# Patient Record
Sex: Male | Born: 1990 | Race: White | Hispanic: Yes | Marital: Single | Smoking: Never smoker
Health system: Southern US, Community
[De-identification: ages and names within clinical notes are randomized; demographics above are authoritative.]

## PROBLEM LIST (undated history)

## (undated) DIAGNOSIS — F1910 Other psychoactive substance abuse, uncomplicated: Secondary | ICD-10-CM

## (undated) HISTORY — DX: Other psychoactive substance abuse, uncomplicated: F19.10

---

## 2013-07-05 ENCOUNTER — Ambulatory Visit: Payer: No Typology Code available for payment source

## 2013-07-05 ENCOUNTER — Telehealth: Payer: Self-pay | Admitting: Family Medicine

## 2013-07-05 ENCOUNTER — Ambulatory Visit (INDEPENDENT_AMBULATORY_CARE_PROVIDER_SITE_OTHER): Payer: No Typology Code available for payment source | Admitting: Family Medicine

## 2013-07-05 VITALS — BP 108/70 | HR 53 | Temp 98.1°F | Resp 16 | Ht 68.0 in | Wt 144.0 lb

## 2013-07-05 DIAGNOSIS — R002 Palpitations: Secondary | ICD-10-CM

## 2013-07-05 DIAGNOSIS — R21 Rash and other nonspecific skin eruption: Secondary | ICD-10-CM

## 2013-07-05 DIAGNOSIS — R079 Chest pain, unspecified: Secondary | ICD-10-CM

## 2013-07-05 DIAGNOSIS — L738 Other specified follicular disorders: Secondary | ICD-10-CM

## 2013-07-05 DIAGNOSIS — I498 Other specified cardiac arrhythmias: Secondary | ICD-10-CM

## 2013-07-05 DIAGNOSIS — L739 Follicular disorder, unspecified: Secondary | ICD-10-CM

## 2013-07-05 DIAGNOSIS — L678 Other hair color and hair shaft abnormalities: Secondary | ICD-10-CM

## 2013-07-05 DIAGNOSIS — R001 Bradycardia, unspecified: Secondary | ICD-10-CM

## 2013-07-05 LAB — COMPREHENSIVE METABOLIC PANEL
ALT: 19 U/L (ref 0–53)
AST: 29 U/L (ref 0–37)
Albumin: 4.6 g/dL (ref 3.5–5.2)
Chloride: 105 mEq/L (ref 96–112)
Creat: 1.04 mg/dL (ref 0.50–1.35)
Sodium: 138 mEq/L (ref 135–145)
Total Bilirubin: 0.5 mg/dL (ref 0.3–1.2)
Total Protein: 7 g/dL (ref 6.0–8.3)

## 2013-07-05 LAB — COMPREHENSIVE METABOLIC PANEL WITH GFR
Alkaline Phosphatase: 72 U/L (ref 39–117)
BUN: 14 mg/dL (ref 6–23)
CO2: 22 meq/L (ref 19–32)
Calcium: 9.7 mg/dL (ref 8.4–10.5)
Glucose, Bld: 120 mg/dL — ABNORMAL HIGH (ref 70–99)
Potassium: 4 meq/L (ref 3.5–5.3)

## 2013-07-05 MED ORDER — DOXYCYCLINE HYCLATE 100 MG PO TABS: 100.0000 mg | ORAL_TABLET | Freq: Two times a day (BID) | ORAL | Status: AC

## 2013-07-05 NOTE — Progress Notes (Signed)
 Chief Complaint:  Chief Complaint  Patient presents with  . Palpitations  . Loss of Consciousness    passed out once two weeks after doing cocaine. Has felt palpitations since then.  . Cyst    buttocks. x 1 year    HPI: Nathaniel Humphrey is a 23 y.o. male who is here for: 1. Left sided cyst rash for 1 year, he has them off and on, he does sweat some. Prior to all of this he was scratching the area, there is some pain when he sits on it.   2. He had LOC for about about 4 sec ging from sitting to standing position at dinner table 2 weeks ago. No other signs ie confusion, seizures, HAs, gait cahnges, vision changes,  lethargy, weakness, SOB. This was a witnessed event 2 weeks ago, it has not happened since. OF pertinent interest 4  weeks ago was the first time he used cocaine, he used the cocaine at a party. Since then he has had intermittent CP, mostly on right side. He felt he has had right sided non-radiating CP,  palpitations. His chest is not hurting as much as it did 4 weeks ago. He has been doing cardiovascular exercise, he has been running and doing his usual weightlifitng without problems. No loss of concentration or lethargy. No family history of seizures or arrhthmias.   Prior to all of this he did not have any heart problems. No thyroid issues. No unintentional weightloss. Denies HA, confusion, SI/HI/halluciantions, N/V/abd pain, diaphoresius. He smokes cannibus sometimes, last week was his last use of cannibis. No prior problems with cannibis and heart in the past. He has been working out, last time in the gym was Friday , he does a lot of cardio and lifting. Takes mutlivitamins but no other supplements ie caffeine or herbal.   Past Medical History  Diagnosis Date  . Substance abuse     cocaine use for the first time about 2 weeks ago, had CP and palpitations, also uses occ marijuan   History reviewed. No pertinent past surgical history. History   Social History  . Marital  Status: Single    Spouse Name: N/A    Number of Children: N/A  . Years of Education: N/A   Social History Main Topics  . Smoking status: Never Smoker   . Smokeless tobacco: None  . Alcohol Use: Yes  . Drug Use: Yes    Special: Marijuana  . Sexual Activity: Yes    Partners: Female   Other Topics Concern  . None   Social History Narrative  . None   Family History  Problem Relation Age of Onset  . Hepatitis B Mother   . Hypertension Father   . Hypertension Paternal Grandmother   . Diabetes Paternal Grandmother    No Known Allergies Prior to Admission medications   Medication Sig Start Date End Date Taking? Authorizing Provider  Multiple Vitamin (MULTIVITAMIN) tablet Take 1 tablet by mouth daily.   Yes Historical Provider, MD     ROS: The patient denies fevers, chills, night sweats, unintentional weight loss,  wheezing, dyspnea on exertion, nausea, vomiting, abdominal pain, dysuria, hematuria, melena, numbness, weakness, or tingling.   All other systems have been reviewed and were otherwise negative with the exception of those mentioned in the HPI and as above.    PHYSICAL EXAM: Filed Vitals:   07/05/13 1140  BP: 108/70  Pulse: 53 ( he is a runner)  Temp: 98.1 F (36.7  C)  Resp: 16  Spo2 100% Filed Vitals:   07/05/13 1140  Height: 5\' 8"  (1.727 m)  Weight: 144 lb (65.318 kg)   Body mass index is 21.9 kg/(m^2).  General: Alert, no acute distress HEENT:  Normocephalic, atraumatic, oropharynx patent. EOMI, PERRLA, fundoscopic exam nl.No thyroidmegaly Cardiovascular:  Sinus Brady, no rubs murmurs or gallops.  No Carotid bruits, radial pulse intact. No pedal edema.  Respiratory: Clear to auscultation bilaterally.  No wheezes, rales, or rhonchi.  No cyanosis, no use of accessory musculature GI: No organomegaly, abdomen is soft and non-tender, positive bowel sounds.  No masses. Skin: + scarring from prior buttock abscesses on left butt cheek, there is one area of  minimal fluctauance on left middle butt cheek, tender, noo erythema. Right buttcheek has some old scars as well Neurologic: Facial musculature symmetric. CN 2-12 grossly intact.  Psychiatric: Patient is appropriate throughout our interaction. Lymphatic: No cervical lymphadenopathy Musculoskeletal: Gait intact.   LABS: No results found for this or any previous visit.   EKG/XRAY:   [facility] EKG NSR 49-53 bpm,no ST elevation or depression, no QT prolongation, nonspecific peaked Ts in ant leads CXR show no acute cardiopulmonary process   ASSESSMENT/PLAN: Encounter Diagnoses  Name Primary?  . Chest pain Yes  . Palpitations   . Rash and nonspecific skin eruption   . Folliculitis   . Sinus bradycardia by electrocardiogram    Pleasant 23 year old male who is here with resolved/resolving nonradiating right sided CP which started 4 weeks ago after using cocaine for the 1st time. This was associated with a 1 time episode of LOC which sounds like orthostatic hypotension from sitting to standing position  witnessed by his friend who was also at the dinner table  with him. He also occasionally does marijuana. He states he is not doing anymore drugs because he had this health scare. Wants to know if his heart is ok. Since his sxs are mostly resolved, I do not feel the need to get any labs stat. He does not need drug screen since he has admitted to drug use.  EKG was sinus brady--he is a runner Labs pending CBC, CMP, troponin I  ( we forgot to draw labs, he will come back at 3:30 to do this, already been ordered) Chest xray-patient was discharged without having CXR completed. He will return to get CXR and labs done only, Lucianne MussLiz Egan will read CXR for me.  He declined referral to cardiology Neuro exam was normal , will cont to monitor for changes, if he has any then need to go to ER  Advise to stop using recreational drugs, supplements  Attempted to IandD minimal  folliculitis/abscess-no pus, he will be rx doxycycline for folliculitis.  F/u prn or go to ER is worsenign sxs.    Gross sideeffects, risk and benefits, and alternatives of medications d/w patient. Patient is aware that all medications have potential sideeffects and we are unable to predict every sideeffect or drug-drug interaction that may occur.  ,  PHUONG, DO 07/05/2013 2:40 PM   Patient came back for labs, he had a vagal episode and brady down while getting his blood drawn. He states that t has happened before when he had wisdom teeth taken out. Our CMA mentioned he might have convulsed alittle as well. His VSS after all of this. We trie putting in trendelenburg and raisine his legs as best as we could. BP 100/70, HR 55-60. CXR was WNL.  Only able to obtain CMP and troponin due to incomplete blood draw. He was neurologically ad hemodynamically intact at time od discharge.He agrees to be referred to cardiology.

## 2013-07-05 NOTE — Progress Notes (Deleted)
   Subjective:    Patient ID: Nathaniel Humphrey, male    DOB: 08/28/1990, [age] y.o.   MRN: [id number]  HPI pt here c/o of a cyst on       Chief Complaint:  Chief Complaint  Patient presents with  . Palpitations  . Loss of Consciousness    passed out once two weeks after doing cocaine. Has felt palpitations since then.  . Cyst    buttocks. x 1 year    HPI: Nathaniel Humphrey is a 23 y.o. male who is here for  ***  History reviewed. No pertinent past medical history. History reviewed. No pertinent past surgical history. History   Social History  . Marital Status: Single    Spouse Name: N/A    Number of Children: N/A  . Years of Education: N/A   Social History Main Topics  . Smoking status: Never Smoker   . Smokeless tobacco: None  . Alcohol Use: Yes  . Drug Use: Yes    Special: Marijuana  . Sexual Activity: Yes    Partners: Female   Other Topics Concern  . None   Social History Narrative  . None   Family History  Problem Relation Age of Onset  . Hepatitis B Mother   . Hypertension Father   . Hypertension Paternal Grandmother   . Diabetes Paternal Grandmother    No Known Allergies Prior to Admission medications   Medication Sig Start Date End Date Taking? Authorizing Provider  Multiple Vitamin (MULTIVITAMIN) tablet Take 1 tablet by mouth daily.   Yes Historical Provider, MD     ROS: The patient denies fevers, chills, night sweats, unintentional weight loss, chest pain, palpitations, wheezing, dyspnea on exertion, nausea, vomiting, abdominal pain, dysuria, hematuria, melena, numbness, weakness, or tingling. ***  All other systems have been reviewed and were otherwise negative with the exception of those mentioned in the HPI and as above.    PHYSICAL EXAM: Filed Vitals:   07/05/13 1140  BP: 108/70  Pulse: 53  Temp: 98.1 F (36.7 C)  Resp: 16   Filed Vitals:   07/05/13 1140  Height: 5\' 8"  (1.727 m)  Weight: 144 lb (65.318 kg)   Body mass index is 21.9  kg/(m^2).  General: Alert, no acute distress HEENT:  Normocephalic, atraumatic, oropharynx patent. EOMI, PERRLA Cardiovascular:  Regular rate and rhythm, no rubs murmurs or gallops.  No Carotid bruits, radial pulse intact. No pedal edema.  Respiratory: Clear to auscultation bilaterally.  No wheezes, rales, or rhonchi.  No cyanosis, no use of accessory musculature GI: No organomegaly, abdomen is soft and non-tender, positive bowel sounds.  No masses. Skin: No rashes. Neurologic: Facial musculature symmetric. Psychiatric: Patient is appropriate throughout our interaction. Lymphatic: No cervical lymphadenopathy Musculoskeletal: Gait intact.   LABS: No results found for this or any previous visit.   EKG/XRAY:   Primary [facility].   ASSESSMENT/PLAN: No diagnosis found.   Gross sideeffects, risk and benefits, and alternatives of medications d/w patient. Patient is aware that all medications have potential sideeffects and we are unable to predict every sideeffect or drug-drug interaction that may occur.  Braxton FeathersFabiola, Caria Transue, [facility] 07/05/2013 12:21 PM       Review of Systems     Objective:   Physical Exam        Assessment & Plan:

## 2013-07-05 NOTE — Telephone Encounter (Addendum)
LM and text to return to office to get CMP, Troponin and also chest xray. He was discharged before this was completed. I have spoken with Lucianne MussLiz Egan who also saw the patient and she will be here if he comes in in a timely manner.

## 2013-07-05 NOTE — Progress Notes (Signed)
Procedure Note: Verbal consent obtained.  Local anesthesia with 2 cc 2% lidocaine.  Betadine prep.  Incision with 11 blade.  No purulence expressed.  Wound irrigated with remaining anesthetic.  No packing used.  Cleansed and dressed.  Discussed wound care.

## 2013-07-05 NOTE — Patient Instructions (Signed)
Palpitations   A palpitation is the feeling that your heartbeat is irregular or is faster than normal. It may feel like your heart is fluttering or skipping a beat. Palpitations are usually not a serious problem. However, in some cases, you may need further medical evaluation.  CAUSES   Palpitations can be caused by:   Smoking.   Caffeine or other stimulants, such as diet pills or energy drinks.   Alcohol.   Stress and anxiety.   Strenuous physical activity.   Fatigue.   Certain medicines.   Heart disease, especially if you have a history of arrhythmias. This includes atrial fibrillation, atrial flutter, or supraventricular tachycardia.   An improperly working pacemaker or defibrillator.  DIAGNOSIS   To find the cause of your palpitations, your caregiver will take your history and perform a physical exam. Tests may also be done, including:   Electrocardiography (ECG). This test records the heart's electrical activity.   Cardiac monitoring. This allows your caregiver to monitor your heart rate and rhythm in real time.   Holter monitor. This is a portable device that records your heartbeat and can help diagnose heart arrhythmias. It allows your caregiver to track your heart activity for several days, if needed.   Stress tests by exercise or by giving medicine that makes the heart beat faster.  TREATMENT   Treatment of palpitations depends on the cause of your symptoms and can vary greatly. Most cases of palpitations do not require any treatment other than time, relaxation, and monitoring your symptoms. Other causes, such as atrial fibrillation, atrial flutter, or supraventricular tachycardia, usually require further treatment.  HOME CARE INSTRUCTIONS    Avoid:   Caffeinated coffee, tea, soft drinks, diet pills, and energy drinks.   Chocolate.   Alcohol.   Stop smoking if you smoke.   Reduce your stress and anxiety. Things that can help you relax include:   A method that measures bodily functions so  you can learn to control them (biofeedback).   Yoga.   Meditation.   Physical activity such as swimming, jogging, or walking.   Get plenty of rest and sleep.  SEEK MEDICAL CARE IF:    You continue to have a fast or irregular heartbeat beyond 24 hours.   Your palpitations occur more often.  SEEK IMMEDIATE MEDICAL CARE IF:   You develop chest pain or shortness of breath.   You have a severe headache.   You feel dizzy, or you faint.  MAKE SURE YOU:   Understand these instructions.   Will watch your condition.   Will get help right away if you are not doing well or get worse.  Document Released: 06/07/2000 Document Revised: 10/05/2012 Document Reviewed: 08/09/2011  [employer] Patient Information 2014 [employer]

## 2013-07-06 LAB — TROPONIN I: Troponin I: <0.01 ng/mL (ref <0.06)

## 2015-08-15 IMAGING — CR DG CHEST 2V
2 series · 2 of 2 positions shown · non-contrast
Comparison: None available

CLINICAL DATA: Chest pain

EXAM:
CHEST  2 VIEW

[PA]
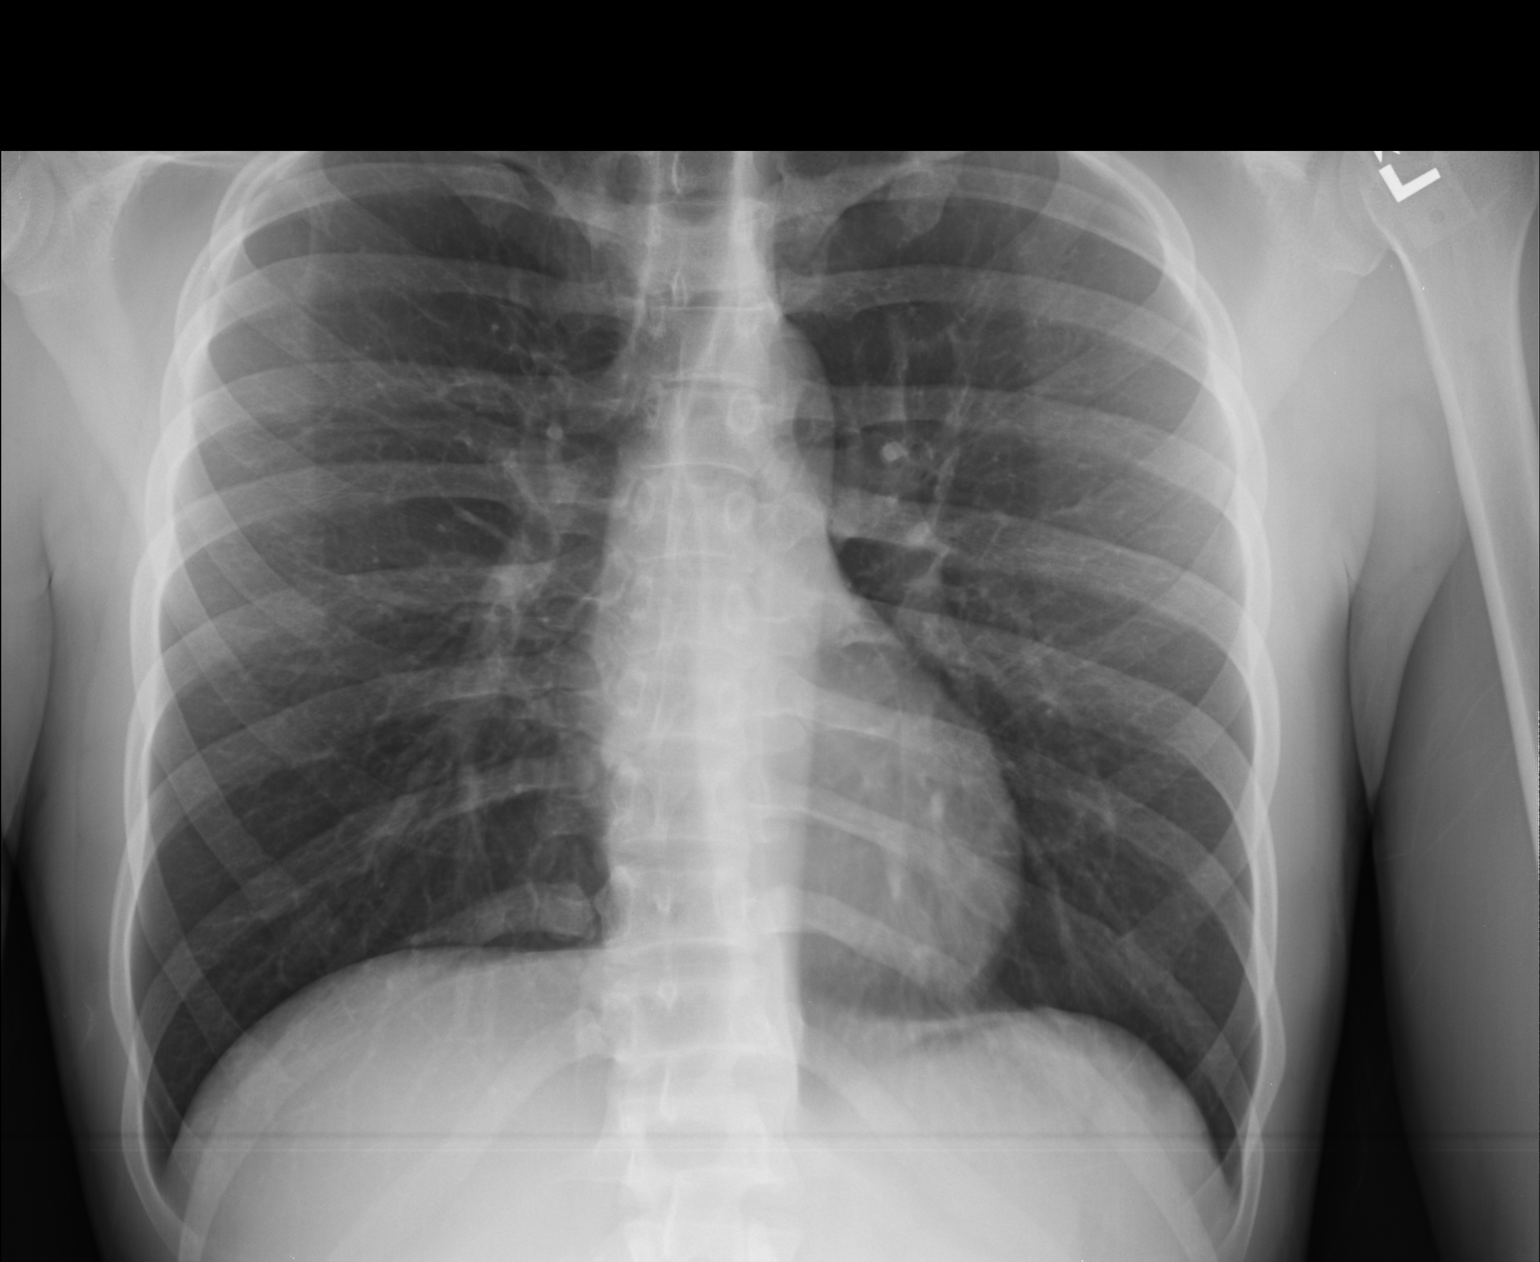

[lateral]
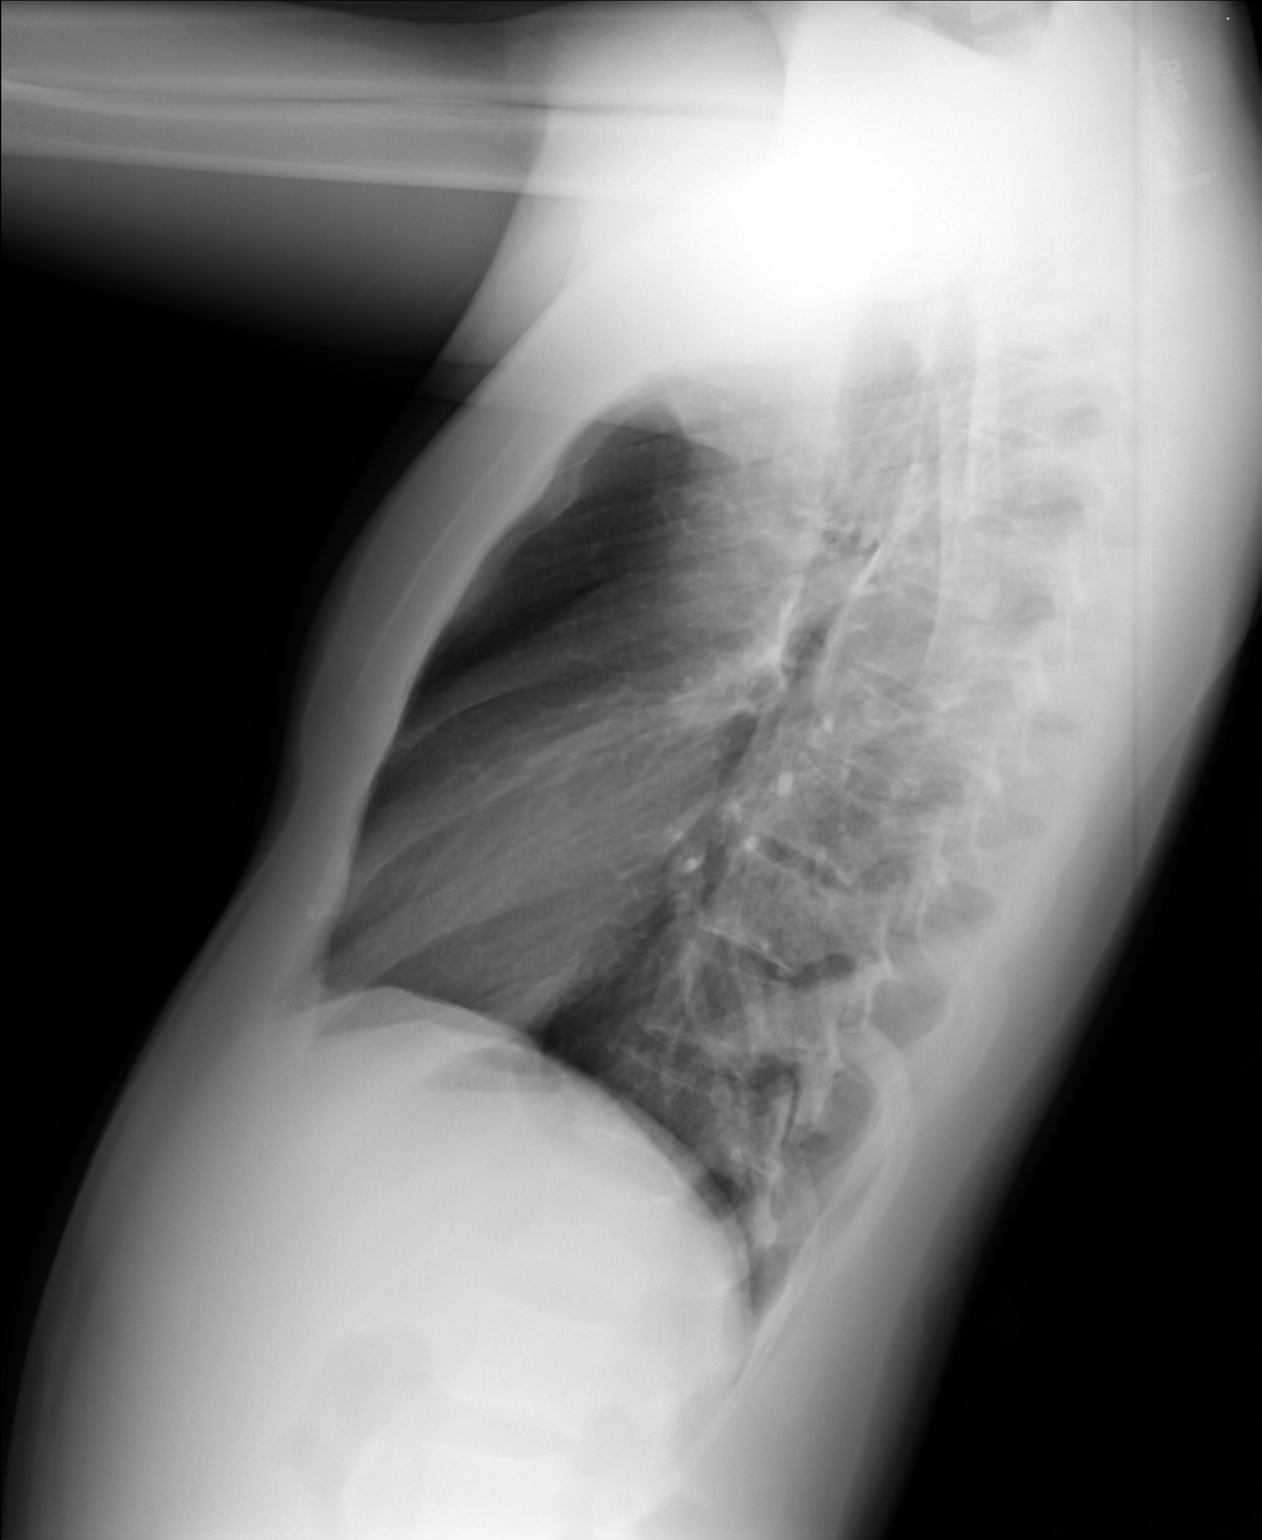

[2 of 2 positions shown; findings below may reference images not displayed]

FINDINGS: The cardiac and mediastinal silhouettes are within normal limits.

The lungs are normally inflated. No airspace consolidation, pleural
effusion, or pulmonary edema is identified. There is no
pneumothorax.

No acute osseous abnormality identified.
IMPRESSION: No active cardiopulmonary disease.
